# Patient Record
Sex: Female | Born: 2000 | Race: Black or African American | Hispanic: No | Marital: Single | Smoking: Never smoker
Health system: Southern US, Community
[De-identification: ages and names within clinical notes are randomized; demographics above are authoritative.]

## PROBLEM LIST (undated history)

## (undated) DIAGNOSIS — J45909 Unspecified asthma, uncomplicated: Secondary | ICD-10-CM

## (undated) DIAGNOSIS — L309 Dermatitis, unspecified: Secondary | ICD-10-CM

---
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
## 2021-05-04 IMAGING — CR DG CHEST 2V
2 series · 2 of 2 positions shown · non-contrast
Comparison: 01/04/2014

CLINICAL DATA: Shortness of breath

EXAM:
CHEST - 2 VIEW

[chest pa]
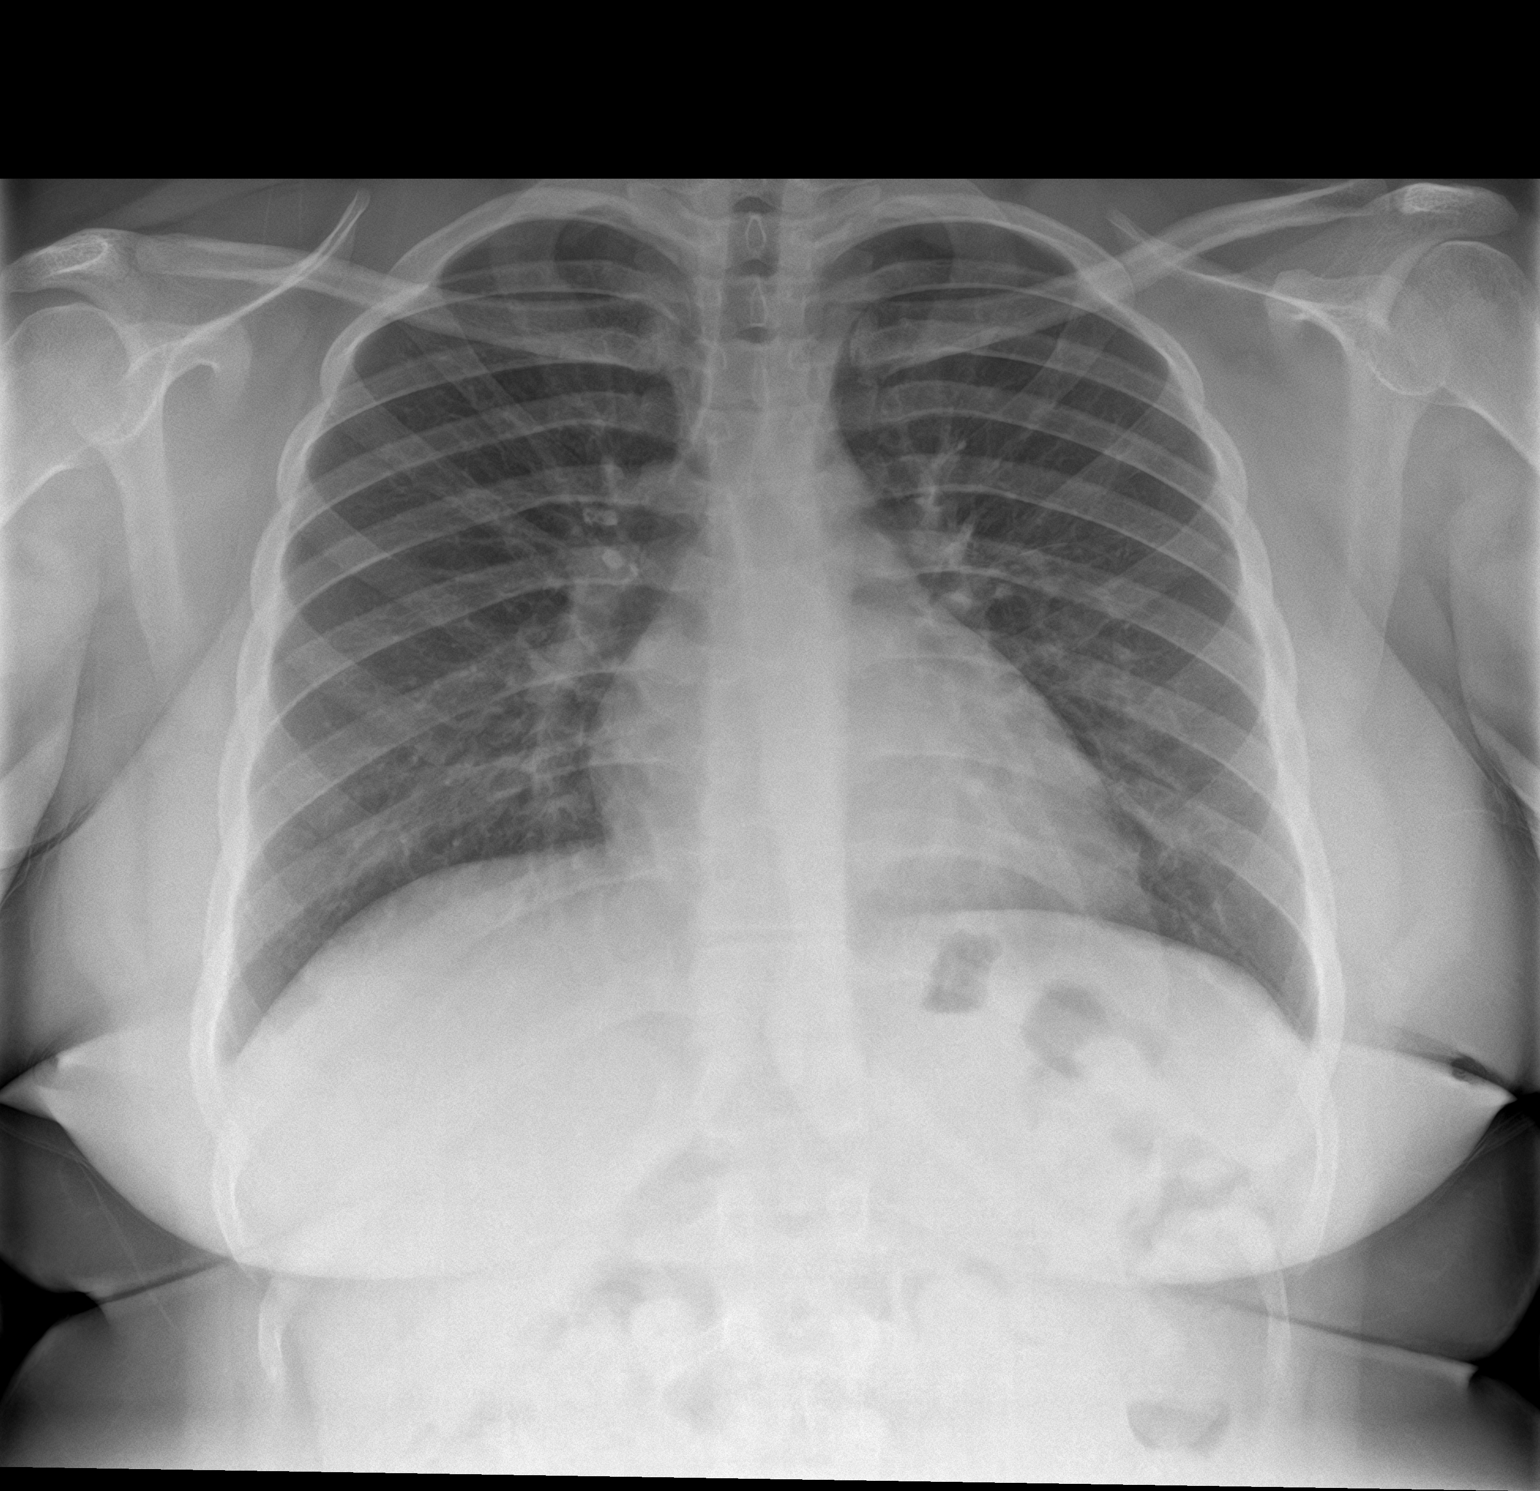

[chest lat]
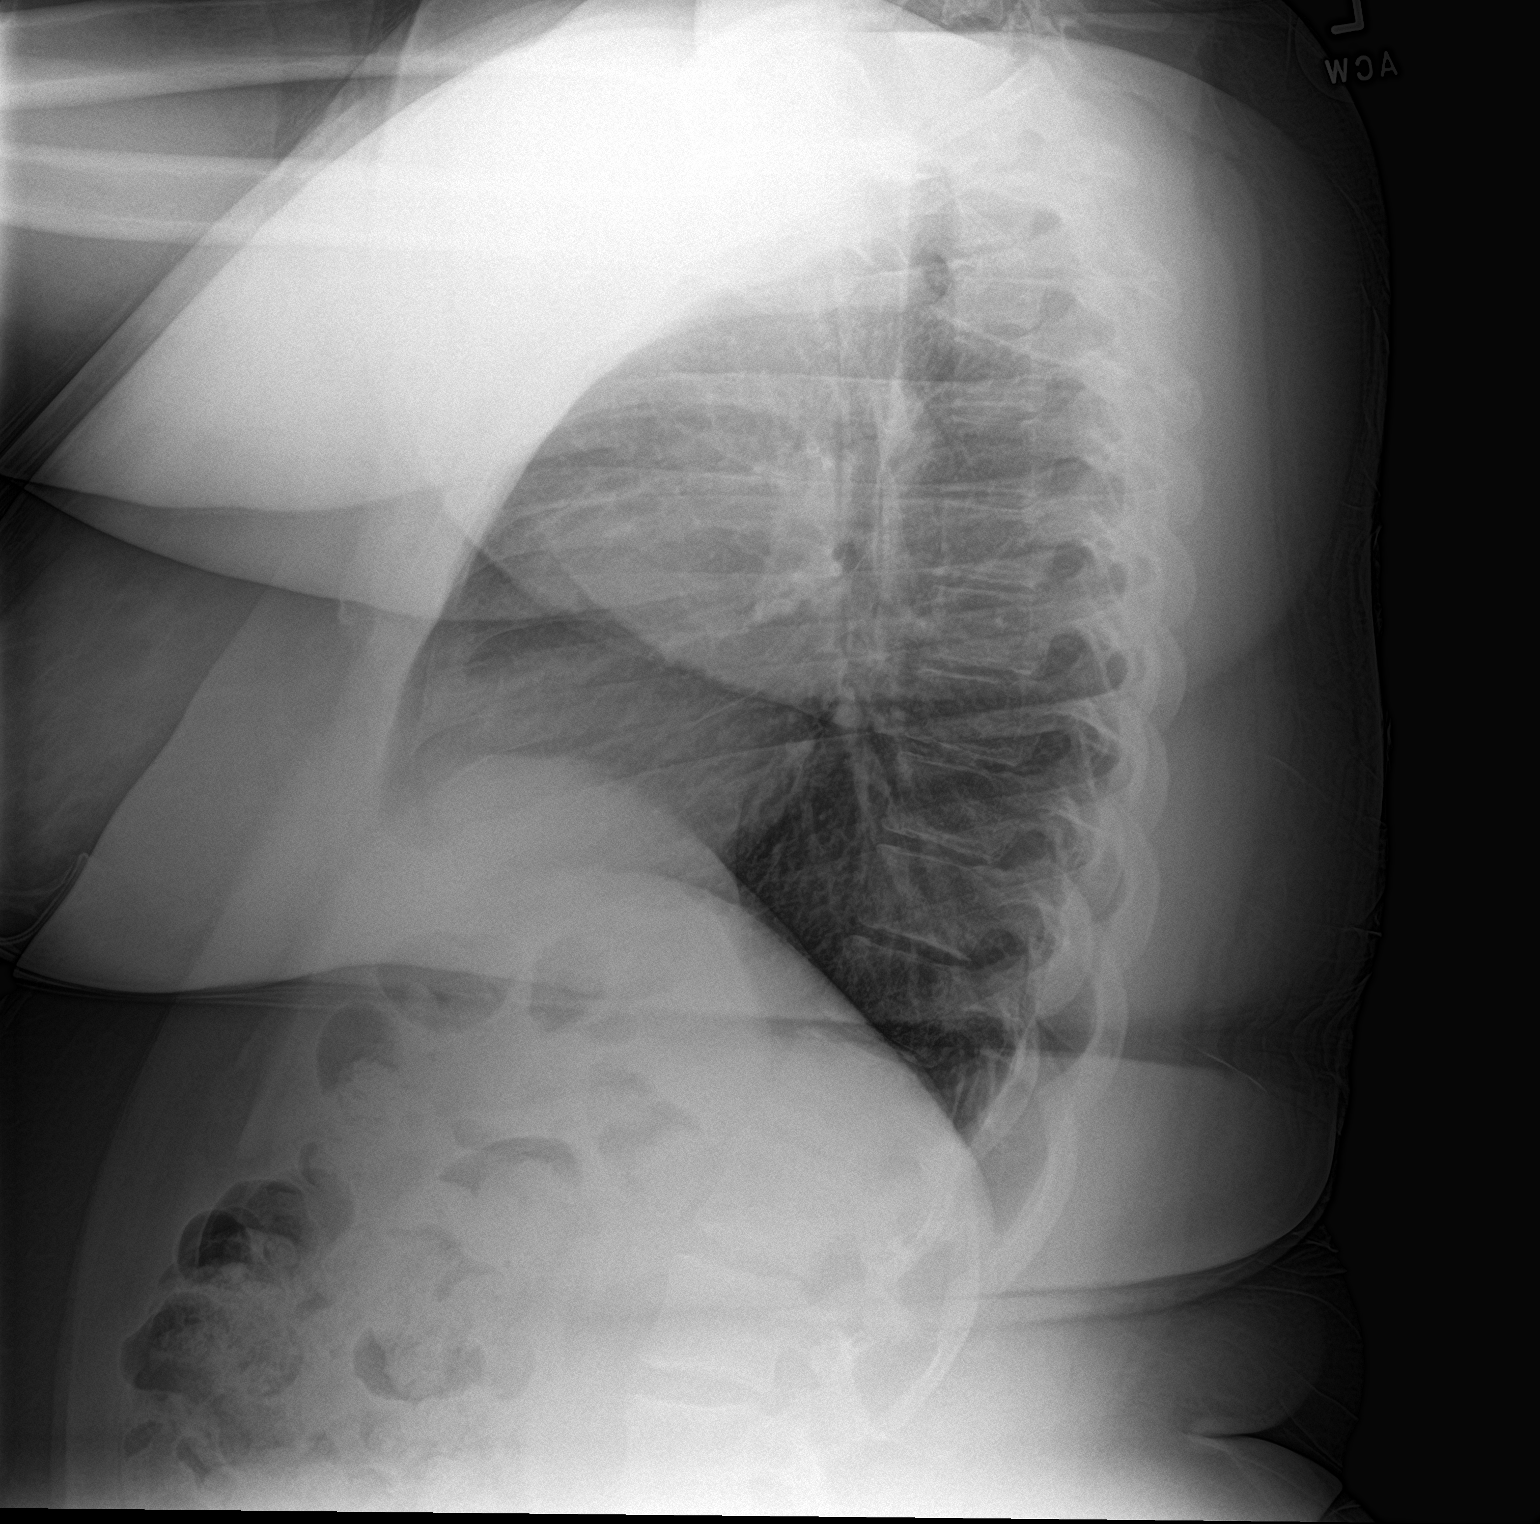

[2 of 2 positions shown; findings below may reference images not displayed]

FINDINGS: The heart size and mediastinal contours are within normal limits.
Both lungs are clear. The visualized skeletal structures are
unremarkable.
IMPRESSION: No active cardiopulmonary disease.
